# Patient Record
Sex: Female | Born: 1995 | Race: White | Hispanic: No | Marital: Single | Smoking: Current every day smoker
Health system: Southern US, Community
[De-identification: ages and names within clinical notes are randomized; demographics above are authoritative.]

## PROBLEM LIST (undated history)

## (undated) DIAGNOSIS — G510 Bell's palsy: Secondary | ICD-10-CM

## (undated) HISTORY — PX: BREAST LUMPECTOMY: SHX2

---
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
## 2017-03-15 ENCOUNTER — Encounter (HOSPITAL_COMMUNITY): Payer: Self-pay | Admitting: Emergency Medicine

## 2017-03-15 ENCOUNTER — Emergency Department (HOSPITAL_COMMUNITY)
Admission: EM | Admit: 2017-03-15 | Discharge: 2017-03-15 | Disposition: A | Discharge status: Home / Self Care | Payer: Medicaid Other | Attending: Emergency Medicine | Admitting: Emergency Medicine

## 2017-03-15 DIAGNOSIS — N12 Tubulo-interstitial nephritis, not specified as acute or chronic: Secondary | ICD-10-CM

## 2017-03-15 DIAGNOSIS — E860 Dehydration: Secondary | ICD-10-CM | POA: Diagnosis not present

## 2017-03-15 DIAGNOSIS — Z79899 Other long term (current) drug therapy: Secondary | ICD-10-CM | POA: Insufficient documentation

## 2017-03-15 DIAGNOSIS — R509 Fever, unspecified: Secondary | ICD-10-CM | POA: Diagnosis present

## 2017-03-15 DIAGNOSIS — F17210 Nicotine dependence, cigarettes, uncomplicated: Secondary | ICD-10-CM | POA: Diagnosis not present

## 2017-03-15 HISTORY — DX: Bell's palsy: G51.0

## 2017-03-15 LAB — COMPREHENSIVE METABOLIC PANEL
ALBUMIN: 3.6 g/dL (ref 3.5–5.0)
ALK PHOS: 60 U/L (ref 38–126)
ALT: 16 U/L (ref 14–54)
AST: 19 U/L (ref 15–41)
Anion gap: 8 (ref 5–15)
BILIRUBIN TOTAL: 0.8 mg/dL (ref 0.3–1.2)
BUN: 9 mg/dL (ref 6–20)
CHLORIDE: 106 mmol/L (ref 101–111)
CO2: 22 mmol/L (ref 22–32)
CREATININE: 0.78 mg/dL (ref 0.44–1.00)
Calcium: 8.6 mg/dL — ABNORMAL LOW (ref 8.9–10.3)
GFR calc non Af Amer: >60 mL/min (ref >60)
GLUCOSE: 95 mg/dL (ref 65–99)
Potassium: 3.3 mmol/L — ABNORMAL LOW (ref 3.5–5.1)
Sodium: 136 mmol/L (ref 135–145)
Total Protein: 6.8 g/dL (ref 6.5–8.1)

## 2017-03-15 LAB — CBC WITH DIFFERENTIAL/PLATELET
BASOS ABS: 0 10*3/uL (ref 0.0–0.1)
BASOS PCT: 0 %
Eosinophils Absolute: 0 10*3/uL (ref 0.0–0.7)
Eosinophils Relative: 0 %
HEMATOCRIT: 37.9 % (ref 36.0–46.0)
HEMOGLOBIN: 12.9 g/dL (ref 12.0–15.0)
Lymphocytes Relative: 5 %
Lymphs Abs: 1 10*3/uL (ref 0.7–4.0)
MCH: 29.7 pg (ref 26.0–34.0)
MCHC: 34 g/dL (ref 30.0–36.0)
MCV: 87.3 fL (ref 78.0–100.0)
Monocytes Absolute: 2 10*3/uL — ABNORMAL HIGH (ref 0.1–1.0)
Monocytes Relative: 11 %
NEUTROS ABS: 16 10*3/uL — AB (ref 1.7–7.7)
NEUTROS PCT: 84 %
Platelets: 173 10*3/uL (ref 150–400)
RBC: 4.34 MIL/uL (ref 3.87–5.11)
RDW: 13.1 % (ref 11.5–15.5)
WBC: 19 10*3/uL — AB (ref 4.0–10.5)

## 2017-03-15 LAB — URINALYSIS, ROUTINE W REFLEX MICROSCOPIC
Bilirubin Urine: NEGATIVE
GLUCOSE, UA: NEGATIVE mg/dL
Ketones, ur: 80 mg/dL — AB
Nitrite: NEGATIVE
PROTEIN: 100 mg/dL — AB
Specific Gravity, Urine: 1.021 (ref 1.005–1.030)
pH: 5 (ref 5.0–8.0)

## 2017-03-15 LAB — LIPASE, BLOOD: Lipase: 23 U/L (ref 11–51)

## 2017-03-15 LAB — PREGNANCY, URINE: PREG TEST UR: NEGATIVE

## 2017-03-15 MED ORDER — ONDANSETRON HCL 4 MG PO TABS: 4.0000 mg | ORAL_TABLET | Freq: Four times a day (QID) | ORAL | 0 refills | Status: AC | PRN

## 2017-03-15 MED ORDER — DEXTROSE 5 % IV SOLN
1.0000 g | Freq: Once | INTRAVENOUS | Status: AC
  Administered 2017-03-15: 1 g via INTRAVENOUS
  Filled 2017-03-15: qty 10

## 2017-03-15 MED ORDER — KETOROLAC TROMETHAMINE 30 MG/ML IJ SOLN
30.0000 mg | Freq: Once | INTRAMUSCULAR | Status: AC
  Administered 2017-03-15: 30 mg via INTRAVENOUS
  Filled 2017-03-15: qty 1

## 2017-03-15 MED ORDER — SODIUM CHLORIDE 0.9 % IV BOLUS (SEPSIS)
2000.0000 mL | Freq: Once | INTRAVENOUS | Status: AC
  Administered 2017-03-15: 2000 mL via INTRAVENOUS

## 2017-03-15 NOTE — ED Provider Notes (Signed)
[facility]-EMERGENCY DEPT Provider Note   CSN: [id number] Arrival date & time: 03/15/17  1254     History   Chief Complaint Chief Complaint  Patient presents with  . Dysuria    HPI Danielle Petersen is a 21 y.o. female.  HPI Patient with 2-3 days of fever, generalized myalgias, headache, urinary frequency, left flank pain and fatigue. Patient also has had episodic nausea and decreased oral intake. Denies diarrhea. No sore throat or rash. Was seen in the emergency department at [facility] yesterday diagnosed with UTI. Given prescription for [facility] which the patient has not filled. Past Medical History:  Diagnosis Date  . Bell's palsy     There are no active problems to display for this patient.   Past Surgical History:  Procedure Laterality Date  . BREAST LUMPECTOMY      OB History    No data available       Home Medications    Prior to Admission medications   Medication Sig Start Date End Date Taking? Authorizing Provider  ibuprofen (ADVIL,MOTRIN) 200 MG tablet Take 200-600 mg by mouth every 6 (six) hours as needed for moderate pain.    Yes [provider]  LO LOESTRIN FE 1 MG-10 MCG / 10 MCG tablet Take 1 tablet by mouth daily. 02/16/17  Yes [provider]  ondansetron (ZOFRAN) 4 MG tablet Take 1 tablet (4 mg total) by mouth every 6 (six) hours as needed for nausea or vomiting. 03/15/17   Loren Racer, MD    Family History History reviewed. No pertinent family history.  Social History Social History  Substance Use Topics  . Smoking status: Current Every Day Smoker    Packs/day: 0.50    Types: Cigarettes  . Smokeless tobacco: Not on file  . Alcohol use No     Allergies   Patient has no known allergies.   Review of Systems Review of Systems  Constitutional: Positive for appetite change, chills, fatigue and fever.  HENT: Positive for congestion and sinus pressure. Negative for facial swelling, sore throat and trouble swallowing.   Eyes:  Negative for visual disturbance.  Respiratory: Negative for cough, shortness of breath and wheezing.   Cardiovascular: Negative for chest pain, palpitations and leg swelling.  Gastrointestinal: Positive for nausea. Negative for abdominal pain, constipation, diarrhea and vomiting.  Genitourinary: Positive for flank pain and frequency. Negative for difficulty urinating, dysuria, hematuria, pelvic pain, vaginal bleeding and vaginal discharge.  Musculoskeletal: Positive for back pain, myalgias and neck pain. Negative for neck stiffness.  Skin: Negative for rash and wound.  Neurological: Positive for headaches. Negative for dizziness, weakness, light-headedness and numbness.  All other systems reviewed and are negative.    Physical Exam Updated Vital Signs BP 122/74 (BP Location: Right Arm)   Pulse 85   Temp 98.7 F (37.1 C) (Oral)   Resp 18   Ht 5\' 3"  (1.6 m)   Wt 55.3 kg (122 lb)   LMP 02/18/2017   SpO2 99%   BMI 21.61 kg/m   Physical Exam  Constitutional: She is oriented to person, place, and time. She appears well-developed and well-nourished. No distress.  HENT:  Head: Normocephalic and atraumatic.  Mouth/Throat: Oropharynx is clear and moist. No oropharyngeal exudate.  Mild sinus tenderness to percussion especially in the right frontal sinus. Oropharynx is mildly erythematous without tonsillar exudate. Uvula is midline.  Eyes: Pupils are equal, round, and reactive to light. EOM are normal.  Neck: Normal range of motion. Neck supple.  No meningismus or  cervical adenopathy. Patient does have some diffuse cervical paraspinal and bilateral trapezius muscle tenderness.  Cardiovascular: Regular rhythm.  Exam reveals no gallop and no friction rub.   No murmur heard. Tachycardia  Pulmonary/Chest: Effort normal and breath sounds normal. No stridor. No respiratory distress. She has no wheezes. She has no rales. She exhibits no tenderness.  Abdominal: Soft. Bowel sounds are normal. There  is no tenderness. There is no rebound and no guarding.  Musculoskeletal: Normal range of motion. She exhibits tenderness. She exhibits no edema.  Patient with left CVA tenderness to percussion. No midline thoracic or lumbar tenderness. No lower extremity swelling, asymmetry or tenderness. Distal pulses are 2+.  Lymphadenopathy:    She has no cervical adenopathy.  Neurological: She is alert and oriented to person, place, and time.  5/5 motor in all extremities. Sensation fully intact.  Skin: Skin is warm and dry. Capillary refill takes less than 2 seconds. No rash noted. She is not diaphoretic. No erythema.  Psychiatric: She has a normal mood and affect. Her behavior is normal.  Nursing note and vitals reviewed.    ED Treatments / Results  Labs (all labs ordered are listed, but only abnormal results are displayed) Labs Reviewed  CBC WITH DIFFERENTIAL/PLATELET - Abnormal; Notable for the following:       Result Value   WBC 19.0 (*)    Neutro Abs 16.0 (*)    Monocytes Absolute 2.0 (*)    All other components within normal limits  COMPREHENSIVE METABOLIC PANEL - Abnormal; Notable for the following:    Potassium 3.3 (*)    Calcium 8.6 (*)    All other components within normal limits  URINALYSIS, ROUTINE W REFLEX MICROSCOPIC - Abnormal; Notable for the following:    APPearance CLOUDY (*)    Hgb urine dipstick SMALL (*)    Ketones, ur 80 (*)    Protein, ur 100 (*)    Leukocytes, UA MODERATE (*)    Bacteria, UA RARE (*)    Squamous Epithelial / LPF 6-30 (*)    Non Squamous Epithelial 0-5 (*)    All other components within normal limits  URINE CULTURE  LIPASE, BLOOD  PREGNANCY, URINE    EKG  EKG Interpretation None       Radiology No results found.  Procedures Procedures (including critical care time)  Medications Ordered in ED Medications  sodium chloride 0.9 % bolus 2,000 mL (0 mLs Intravenous Stopped 03/15/17 1605)  ketorolac (TORADOL) 30 MG/ML injection 30 mg (30  mg Intravenous Given 03/15/17 1355)  cefTRIAXone (ROCEPHIN) 1 g in dextrose 5 % 50 mL IVPB (0 g Intravenous Stopped 03/15/17 1605)     Initial Impression / Assessment and Plan / ED Course  I have reviewed the triage vital signs and the nursing notes.  Pertinent labs & imaging results that were available during my care of the patient were reviewed by me and considered in my medical decision making (see chart for details).    Clinically patient has pyelonephritis on the left. She may also have URI in conjunction. Give IV fluids, anti-inflammatory and IV Rocephin.  Patient says she's feeling better after medication and IV fluids. Tachycardia has resolved. She is well-appearing. Advised to fill the prescription she had from [place]. Return precautions have been given. Final Clinical Impressions(s) / ED Diagnoses   Final diagnoses:  Pyelonephritis  Dehydration    New Prescriptions Discharge Medication List as of 03/15/2017  3:54 PM    START taking these medications  Details  ondansetron (ZOFRAN) 4 MG tablet Take 1 tablet (4 mg total) by mouth every 6 (six) hours as needed for nausea or vomiting., Starting Sat 03/15/2017, Print         Loren Racer, MD 03/16/17 [phone]

## 2017-03-15 NOTE — ED Triage Notes (Signed)
Pt reports fever, chills, left flank pain, and nausea for the past few days.  Dx with UTI at [facility] last night and given rx for Omnicef, but pt does not believe them and wanted a second opinion.  Did not start abx.  Took Motrin PTA.

## 2017-03-16 LAB — URINE CULTURE: Culture: NO GROWTH

## 2017-07-30 ENCOUNTER — Emergency Department (HOSPITAL_COMMUNITY)
Admission: EM | Admit: 2017-07-30 | Discharge: 2017-07-30 | Disposition: A | Discharge status: Home / Self Care | Payer: Self-pay | Attending: Emergency Medicine | Admitting: Emergency Medicine

## 2017-07-30 ENCOUNTER — Other Ambulatory Visit: Payer: Self-pay

## 2017-07-30 ENCOUNTER — Encounter (HOSPITAL_COMMUNITY): Payer: Self-pay | Admitting: Emergency Medicine

## 2017-07-30 DIAGNOSIS — F17210 Nicotine dependence, cigarettes, uncomplicated: Secondary | ICD-10-CM | POA: Insufficient documentation

## 2017-07-30 DIAGNOSIS — Z79899 Other long term (current) drug therapy: Secondary | ICD-10-CM | POA: Insufficient documentation

## 2017-07-30 DIAGNOSIS — N12 Tubulo-interstitial nephritis, not specified as acute or chronic: Secondary | ICD-10-CM | POA: Insufficient documentation

## 2017-07-30 DIAGNOSIS — R6883 Chills (without fever): Secondary | ICD-10-CM | POA: Insufficient documentation

## 2017-07-30 DIAGNOSIS — R112 Nausea with vomiting, unspecified: Secondary | ICD-10-CM | POA: Insufficient documentation

## 2017-07-30 LAB — URINALYSIS, ROUTINE W REFLEX MICROSCOPIC
Bilirubin Urine: NEGATIVE
GLUCOSE, UA: NEGATIVE mg/dL
Ketones, ur: NEGATIVE mg/dL
Nitrite: POSITIVE — AB
PH: 7 (ref 5.0–8.0)
PROTEIN: 30 mg/dL — AB
SPECIFIC GRAVITY, URINE: 1.012 (ref 1.005–1.030)

## 2017-07-30 LAB — PREGNANCY, URINE: PREG TEST UR: NEGATIVE

## 2017-07-30 MED ORDER — CEFTRIAXONE SODIUM 1 G IJ SOLR
1.0000 g | Freq: Once | INTRAMUSCULAR | Status: AC
  Administered 2017-07-30: 1 g via INTRAMUSCULAR
  Filled 2017-07-30: qty 10

## 2017-07-30 MED ORDER — KETOROLAC TROMETHAMINE 30 MG/ML IJ SOLN
30.0000 mg | Freq: Once | INTRAMUSCULAR | Status: AC
  Administered 2017-07-30: 30 mg via INTRAMUSCULAR
  Filled 2017-07-30: qty 1

## 2017-07-30 MED ORDER — ONDANSETRON 4 MG PO TBDP
4.0000 mg | ORAL_TABLET | Freq: Once | ORAL | Status: AC
  Administered 2017-07-30: 4 mg via ORAL
  Filled 2017-07-30: qty 1

## 2017-07-30 MED ORDER — LIDOCAINE HCL (PF) 1 % IJ SOLN
INTRAMUSCULAR | Status: AC
  Administered 2017-07-30: 07:00:00
  Filled 2017-07-30: qty 2

## 2017-07-30 MED ORDER — ONDANSETRON 4 MG PO TBDP: 4.0000 mg | ORAL_TABLET | Freq: Three times a day (TID) | ORAL | 0 refills | Status: DC | PRN

## 2017-07-30 MED ORDER — CEPHALEXIN 500 MG PO CAPS: 500.0000 mg | ORAL_CAPSULE | Freq: Three times a day (TID) | ORAL | 0 refills | Status: DC

## 2017-07-30 NOTE — Discharge Instructions (Signed)
You were seen today for right flank pain.  It appears that you have a urinary tract infection.  Take antibiotics as directed.  If you develop fevers or persistent nausea vomiting, worsening pain, you need to be reevaluated.

## 2017-07-30 NOTE — ED Provider Notes (Signed)
[facility] EMERGENCY DEPARTMENT Provider Note   CSN: [id number] Arrival date & time: 07/30/17  4098     History   Chief Complaint Chief Complaint  Patient presents with  . Flank Pain    HPI Danielle Petersen is a 22 y.o. female.  HPI  This is a 22 year old female who presents with right flank pain.  Patient reports 24-hour history of sharp right-sided flank pain that is nonradiating.  She reports similar pain in the past when she had a kidney infection.  She denies any dysuria or hematuria.  No history of kidney stones.  She currently rates her pain at 10 out of 10.  She has not taken anything for the pain.  She reports nausea and vomiting.  Denies fevers but does endorse chills.  Past Medical History:  Diagnosis Date  . Bell's palsy     There are no active problems to display for this patient.   Past Surgical History:  Procedure Laterality Date  . BREAST LUMPECTOMY      OB History    No data available       Home Medications    Prior to Admission medications   Medication Sig Start Date End Date Taking? Authorizing Provider  ibuprofen (ADVIL,MOTRIN) 200 MG tablet Take 200-600 mg by mouth every 6 (six) hours as needed for moderate pain.     [provider]  LO LOESTRIN FE 1 MG-10 MCG / 10 MCG tablet Take 1 tablet by mouth daily. 02/16/17   [provider]  ondansetron (ZOFRAN) 4 MG tablet Take 1 tablet (4 mg total) by mouth every 6 (six) hours as needed for nausea or vomiting. 03/15/17   Loren Racer, MD    Family History History reviewed. No pertinent family history.  Social History Social History   Tobacco Use  . Smoking status: Current Every Day Smoker    Packs/day: 0.50    Types: Cigarettes  . Smokeless tobacco: Never Used  Substance Use Topics  . Alcohol use: No  . Drug use: No     Allergies   Patient has no known allergies.   Review of Systems Review of Systems  Constitutional: Positive for chills. Negative for fever.    Respiratory: Negative for shortness of breath.   Cardiovascular: Negative for chest pain.  Gastrointestinal: Positive for nausea and vomiting. Negative for abdominal pain.  Genitourinary: Positive for flank pain. Negative for dysuria and hematuria.  All other systems reviewed and are negative.    Physical Exam Updated Vital Signs BP 106/60 (BP Location: Right Arm)   Pulse (!) 103   Temp 97.7 F (36.5 C) (Oral)   Resp 17   Ht 5\' 3"  (1.6 m)   Wt 48.5 kg (107 lb)   SpO2 100%   BMI 18.95 kg/m   Physical Exam  Constitutional: She is oriented to person, place, and time. She appears well-developed and well-nourished. No distress.  HENT:  Head: Normocephalic and atraumatic.  Cardiovascular: Normal rate, regular rhythm and normal heart sounds.  Pulmonary/Chest: Effort normal. No respiratory distress. She has no wheezes.  Abdominal: Soft. Bowel sounds are normal. There is no tenderness. There is no guarding.  Genitourinary:  Genitourinary Comments: Right-sided CVA tenderness  Neurological: She is alert and oriented to person, place, and time.  Skin: Skin is warm and dry.  Scaling rash noted just below the umbilicus with mild erythema  Psychiatric: She has a normal mood and affect.  Nursing note and vitals reviewed.    ED Treatments /  Results  Labs (all labs ordered are listed, but only abnormal results are displayed) Labs Reviewed  URINALYSIS, ROUTINE W REFLEX MICROSCOPIC - Abnormal; Notable for the following components:      Result Value   APPearance HAZY (*)    Hgb urine dipstick MODERATE (*)    Protein, ur 30 (*)    Nitrite POSITIVE (*)    Leukocytes, UA TRACE (*)    Bacteria, UA RARE (*)    Squamous Epithelial / LPF 0-5 (*)    All other components within normal limits  URINE CULTURE  PREGNANCY, URINE    EKG  EKG Interpretation None       Radiology No results found.  Procedures Procedures (including critical care time)  Medications Ordered in  ED Medications  cefTRIAXone (ROCEPHIN) injection 1 g (not administered)  lidocaine (PF) (XYLOCAINE) 1 % injection (not administered)  ketorolac (TORADOL) 30 MG/ML injection 30 mg (30 mg Intramuscular Given 07/30/17 0632)  ondansetron (ZOFRAN-ODT) disintegrating tablet 4 mg (4 mg Oral Given 07/30/17 [id number])     Initial Impression / Assessment and Plan / ED Course  I have reviewed the triage vital signs and the nursing notes.  Pertinent labs & imaging results that were available during my care of the patient were reviewed by me and considered in my medical decision making (see chart for details).    Patient presents with right flank pain.  Clinically consistent with pyelonephritis.  Although, patient does not have significant dysuria.  She is mildly tachycardic.  Otherwise nontoxic.  She does have CVA tenderness on exam.  Urine is nitrite positive with 6-30 white cells and rare bacteria.  Urine culture sent.  Patient reports improvement of pain with Toradol.  She is afebrile.  Do not feel that other lab work is indicated at this time.  Patient given IM Rocephin.  Will discharge on Keflex.  After history, exam, and medical workup I feel the patient has been appropriately medically screened and is safe for discharge home. Pertinent diagnoses were discussed with the patient. Patient was given return precautions.   Final Clinical Impressions(s) / ED Diagnoses   Final diagnoses:  Pyelonephritis    ED Discharge Orders    None       Francois Elk, Mayer Maskerourtney F, MD 07/30/17 [phone]

## 2017-07-30 NOTE — ED Triage Notes (Signed)
Pt complains of right flank pain that started 2 days ago and has become worse over the night. Pt states she has not been able to sleep the past couple nights because of the pain. Pt walked down hallway with steady gait. Pt states her pain is a level 10 on a scale of 0 to 10. Pt states she has had nausea and vomiting. Pt states she has only vomited once, which was this morning, in a 24 hour period. Pt denies liquid stools. Pt states she feels weak as well.

## 2017-08-01 LAB — URINE CULTURE

## 2017-08-02 ENCOUNTER — Telehealth: Payer: Self-pay

## 2017-08-02 NOTE — Telephone Encounter (Signed)
Post ED Visit - Positive Culture Follow-up  Culture report reviewed by antimicrobial stewardship pharmacist:  []  Enzo BiNathan Batchelder, Pharm.D. []  Celedonio MiyamotoJeremy Frens, Pharm.D., BCPS AQ-ID [x]  Garvin FilaMike Maccia, Pharm.D., BCPS []  Georgina PillionElizabeth Martin, Pharm.D., BCPS []  HollandMinh Pham, [address] Rainbow BoulevardPharm.D., BCPS, AAHIVP []  Estella HuskMichelle Turner, Pharm.D., BCPS, AAHIVP []  Lysle Pearlachel Rumbarger, PharmD, BCPS []  Blake DivineShannon Parkey, PharmD []  Pollyann SamplesAndy Johnston, PharmD, BCPS  Positive urine culture Treated with Cephalexin, organism sensitive to the same and no further patient follow-up is required at this time.  Jerry CarasCullom, Naileah Karg Burnett 08/02/2017, 11:59 AM

## 2018-07-17 ENCOUNTER — Other Ambulatory Visit: Payer: Self-pay

## 2018-07-17 ENCOUNTER — Emergency Department (HOSPITAL_COMMUNITY)
Admission: EM | Admit: 2018-07-17 | Discharge: 2018-07-17 | Disposition: A | Discharge status: Home / Self Care | Payer: 59 | Attending: Emergency Medicine | Admitting: Emergency Medicine

## 2018-07-17 ENCOUNTER — Encounter (HOSPITAL_COMMUNITY): Payer: Self-pay

## 2018-07-17 DIAGNOSIS — F17210 Nicotine dependence, cigarettes, uncomplicated: Secondary | ICD-10-CM | POA: Diagnosis not present

## 2018-07-17 DIAGNOSIS — M791 Myalgia, unspecified site: Secondary | ICD-10-CM | POA: Diagnosis present

## 2018-07-17 DIAGNOSIS — N3090 Cystitis, unspecified without hematuria: Secondary | ICD-10-CM | POA: Insufficient documentation

## 2018-07-17 DIAGNOSIS — N3001 Acute cystitis with hematuria: Secondary | ICD-10-CM

## 2018-07-17 LAB — URINALYSIS, ROUTINE W REFLEX MICROSCOPIC
Bilirubin Urine: NEGATIVE
Glucose, UA: NEGATIVE mg/dL
Hgb urine dipstick: NEGATIVE
Ketones, ur: 20 mg/dL — AB
Nitrite: POSITIVE — AB
Protein, ur: 30 mg/dL — AB
Specific Gravity, Urine: 1.02 (ref 1.005–1.030)
WBC, UA: >50 WBC/hpf — ABNORMAL HIGH (ref 0–5)
pH: 6 (ref 5.0–8.0)

## 2018-07-17 LAB — COMPREHENSIVE METABOLIC PANEL
ALT: 16 U/L (ref 0–44)
AST: 16 U/L (ref 15–41)
Albumin: 3.8 g/dL (ref 3.5–5.0)
Alkaline Phosphatase: 59 U/L (ref 38–126)
Anion gap: 8 (ref 5–15)
BUN: 8 mg/dL (ref 6–20)
CALCIUM: 8.2 mg/dL — AB (ref 8.9–10.3)
CO2: 17 mmol/L — ABNORMAL LOW (ref 22–32)
Chloride: 110 mmol/L (ref 98–111)
Creatinine, Ser: 0.71 mg/dL (ref 0.44–1.00)
GFR calc non Af Amer: >60 mL/min (ref >60)
Glucose, Bld: 102 mg/dL — ABNORMAL HIGH (ref 70–99)
Potassium: 3.3 mmol/L — ABNORMAL LOW (ref 3.5–5.1)
Sodium: 135 mmol/L (ref 135–145)
Total Bilirubin: 0.7 mg/dL (ref 0.3–1.2)
Total Protein: 6.8 g/dL (ref 6.5–8.1)

## 2018-07-17 LAB — CBC WITH DIFFERENTIAL/PLATELET
ABS IMMATURE GRANULOCYTES: 0.15 10*3/uL — AB (ref 0.00–0.07)
BASOS ABS: 0 10*3/uL (ref 0.0–0.1)
BASOS PCT: 0 %
EOS ABS: 0 10*3/uL (ref 0.0–0.5)
Eosinophils Relative: 0 %
HCT: 40 % (ref 36.0–46.0)
Hemoglobin: 12.9 g/dL (ref 12.0–15.0)
IMMATURE GRANULOCYTES: 1 %
LYMPHS ABS: 0.8 10*3/uL (ref 0.7–4.0)
Lymphocytes Relative: 5 %
MCH: 28.7 pg (ref 26.0–34.0)
MCHC: 32.3 g/dL (ref 30.0–36.0)
MCV: 88.9 fL (ref 80.0–100.0)
MONOS PCT: 4 %
Monocytes Absolute: 0.7 10*3/uL (ref 0.1–1.0)
NEUTROS ABS: 14.5 10*3/uL — AB (ref 1.7–7.7)
NEUTROS PCT: 90 %
NRBC: 0 % (ref 0.0–0.2)
Platelets: 221 10*3/uL (ref 150–400)
RBC: 4.5 MIL/uL (ref 3.87–5.11)
RDW: 12 % (ref 11.5–15.5)
WBC: 15.9 10*3/uL — ABNORMAL HIGH (ref 4.0–10.5)

## 2018-07-17 LAB — INFLUENZA PANEL BY PCR (TYPE A & B)
INFLAPCR: NEGATIVE
INFLBPCR: NEGATIVE

## 2018-07-17 LAB — CBG MONITORING, ED: Glucose-Capillary: 129 mg/dL — ABNORMAL HIGH (ref 70–99)

## 2018-07-17 MED ORDER — ACETAMINOPHEN 325 MG PO TABS
650.0000 mg | ORAL_TABLET | Freq: Once | ORAL | Status: AC
  Administered 2018-07-17: 650 mg via ORAL
  Filled 2018-07-17: qty 2

## 2018-07-17 MED ORDER — CEPHALEXIN 500 MG PO CAPS: 500.0000 mg | ORAL_CAPSULE | Freq: Four times a day (QID) | ORAL | 0 refills | Status: AC

## 2018-07-17 MED ORDER — SODIUM CHLORIDE 0.9 % IV BOLUS
1000.0000 mL | Freq: Once | INTRAVENOUS | Status: AC
  Administered 2018-07-17: 1000 mL via INTRAVENOUS

## 2018-07-17 MED ORDER — KETOROLAC TROMETHAMINE 30 MG/ML IJ SOLN
30.0000 mg | Freq: Once | INTRAMUSCULAR | Status: AC
Start: 2018-07-17 — End: 2018-07-17
  Administered 2018-07-17: 30 mg via INTRAVENOUS
  Filled 2018-07-17: qty 1

## 2018-07-17 MED ORDER — SODIUM CHLORIDE 0.9 % IV SOLN
1.0000 g | Freq: Once | INTRAVENOUS | Status: AC
  Administered 2018-07-17: 1 g via INTRAVENOUS
  Filled 2018-07-17: qty 10

## 2018-07-17 MED ORDER — ONDANSETRON 4 MG PO TBDP: ORAL_TABLET | ORAL | 0 refills | Status: AC

## 2018-07-17 MED ORDER — ONDANSETRON HCL 4 MG/2ML IJ SOLN
4.0000 mg | Freq: Once | INTRAMUSCULAR | Status: AC
  Administered 2018-07-17: 4 mg via INTRAVENOUS
  Filled 2018-07-17: qty 2

## 2018-07-17 NOTE — Discharge Instructions (Signed)
Drink plenty of fluids.  Take Tylenol or Motrin for fevers and aches follow-up with your doctor next week for recheck

## 2018-07-17 NOTE — ED Triage Notes (Signed)
Pt reports generalized body aches, HA, fever, cough that began suddenly last night

## 2018-07-17 NOTE — ED Provider Notes (Signed)
[facility] EMERGENCY DEPARTMENT Provider Note   CSN: [id number] Arrival date & time: 07/17/18  1314     History   Chief Complaint Chief Complaint  Patient presents with  . Generalized Body Aches    HPI Consepcion Hearingmber Petersen is a 22 y.o. female.  Patient complains of fever chills nausea  The history is provided by the patient. No language interpreter was used.  Illness  This is a new problem. The current episode started more than 2 days ago. The problem occurs constantly. The problem has not changed since onset.Pertinent negatives include no chest pain, no abdominal pain and no headaches. Nothing aggravates the symptoms. Nothing relieves the symptoms. She has tried nothing for the symptoms. The treatment provided no relief.    Past Medical History:  Diagnosis Date  . Bell's palsy     There are no active problems to display for this patient.   Past Surgical History:  Procedure Laterality Date  . BREAST LUMPECTOMY       OB History   No obstetric history on file.      Home Medications    Prior to Admission medications   Medication Sig Start Date End Date Taking? Authorizing Provider  acetaminophen (TYLENOL) 325 MG tablet Take 650 mg by mouth every 6 (six) hours as needed.   Yes [provider]  medroxyPROGESTERone (DEPO-PROVERA) 150 MG/ML injection Inject 150 mg into the muscle every 3 (three) months.   Yes [provider]  cephALEXin (KEFLEX) 500 MG capsule Take 1 capsule (500 mg total) by mouth 4 (four) times daily. 07/17/18   Bethann BerkshireZammit, Maely Clements, MD  LO LOESTRIN FE 1 MG-10 MCG / 10 MCG tablet Take 1 tablet by mouth daily. 02/16/17   [provider]  ondansetron (ZOFRAN ODT) 4 MG disintegrating tablet 4mg  ODT q4 hours prn nausea/vomit 07/17/18   Bethann BerkshireZammit, Parvin Stetzer, MD  ondansetron (ZOFRAN) 4 MG tablet Take 1 tablet (4 mg total) by mouth every 6 (six) hours as needed for nausea or vomiting. Patient not taking: Reported on 07/17/2018 03/15/17    Loren RacerYelverton, David, MD    Family History No family history on file.  Social History Social History   Tobacco Use  . Smoking status: Current Every Day Smoker    Packs/day: 0.50    Types: Cigarettes  . Smokeless tobacco: Never Used  Substance Use Topics  . Alcohol use: No  . Drug use: No     Allergies   Patient has no known allergies.   Review of Systems Review of Systems  Constitutional: Positive for fatigue and fever. Negative for appetite change.  HENT: Negative for congestion, ear discharge and sinus pressure.   Eyes: Negative for discharge.  Respiratory: Negative for cough.   Cardiovascular: Negative for chest pain.  Gastrointestinal: Negative for abdominal pain and diarrhea.  Genitourinary: Negative for frequency and hematuria.  Musculoskeletal: Negative for back pain.  Skin: Negative for rash.  Neurological: Negative for seizures and headaches.  Psychiatric/Behavioral: Negative for hallucinations.     Physical Exam Updated Vital Signs BP 115/63   Pulse (!) 118   Temp (!) 100.6 F (38.1 C) (Oral)   Resp 20   Ht 5\' 3"  (1.6 m)   Wt 54.4 kg   SpO2 100%   BMI 21.26 kg/m   Physical Exam Vitals signs reviewed.  Constitutional:      Appearance: She is well-developed.  HENT:     Head: Normocephalic.     Nose: Nose normal.  Eyes:     General:  No scleral icterus.    Conjunctiva/sclera: Conjunctivae normal.  Neck:     Musculoskeletal: Neck supple.     Thyroid: No thyromegaly.  Cardiovascular:     Rate and Rhythm: Normal rate and regular rhythm.     Heart sounds: No murmur. No friction rub. No gallop.   Pulmonary:     Breath sounds: No stridor. No wheezing or rales.  Chest:     Chest wall: No tenderness.  Abdominal:     General: There is no distension.     Tenderness: There is no abdominal tenderness. There is no rebound.  Musculoskeletal: Normal range of motion.  Lymphadenopathy:     Cervical: No cervical adenopathy.  Skin:    Findings: No  erythema or rash.  Neurological:     Mental Status: She is alert and oriented to person, place, and time.     Motor: No abnormal muscle tone.     Coordination: Coordination normal.  Psychiatric:        Behavior: Behavior normal.      ED Treatments / Results  Labs (all labs ordered are listed, but only abnormal results are displayed) Labs Reviewed  CBC WITH DIFFERENTIAL/PLATELET - Abnormal; Notable for the following components:      Result Value   WBC 15.9 (*)    Neutro Abs 14.5 (*)    Abs Immature Granulocytes 0.15 (*)    All other components within normal limits  COMPREHENSIVE METABOLIC PANEL - Abnormal; Notable for the following components:   Potassium 3.3 (*)    CO2 17 (*)    Glucose, Bld 102 (*)    Calcium 8.2 (*)    All other components within normal limits  URINALYSIS, ROUTINE W REFLEX MICROSCOPIC - Abnormal; Notable for the following components:   Color, Urine Danielle Petersen (*)    APPearance HAZY (*)    Ketones, ur 20 (*)    Protein, ur 30 (*)    Nitrite POSITIVE (*)    Leukocytes, UA TRACE (*)    WBC, UA >50 (*)    Bacteria, UA MANY (*)    All other components within normal limits  CBG MONITORING, ED - Abnormal; Notable for the following components:   Glucose-Capillary 129 (*)    All other components within normal limits  URINE CULTURE  INFLUENZA PANEL BY PCR (TYPE A & B)    EKG None  Radiology No results found.  Procedures Procedures (including critical care time)  Medications Ordered in ED Medications  cefTRIAXone (ROCEPHIN) 1 g in sodium chloride 0.9 % 100 mL IVPB (has no administration in time range)  sodium chloride 0.9 % bolus 1,000 mL (0 mLs Intravenous Stopped 07/17/18 1903)  ketorolac (TORADOL) 30 MG/ML injection 30 mg (30 mg Intravenous Given 07/17/18 1626)  ondansetron (ZOFRAN) injection 4 mg (4 mg Intravenous Given 07/17/18 1626)     Initial Impression / Assessment and Plan / ED Course  I have reviewed the triage vital signs and the nursing  notes.  Pertinent labs & imaging results that were available during my care of the patient were reviewed by me and considered in my medical decision making (see chart for details).     Patient with urinary tract infection.  She is given Keflex and Zofran and will follow-up with her doctor  Final Clinical Impressions(s) / ED Diagnoses   Final diagnoses:  Acute cystitis with hematuria    ED Discharge Orders         Ordered    cephALEXin (KEFLEX) 500 MG  capsule  4 times daily     07/17/18 1959    ondansetron (ZOFRAN ODT) 4 MG disintegrating tablet     07/17/18 1959           Bethann BerkshireZammit, Chawn Spraggins, MD 07/17/18 2002

## 2018-07-17 NOTE — ED Triage Notes (Signed)
Pt provided a face mask due to symptoms

## 2018-07-20 LAB — URINE CULTURE: Culture: >=100000 — AB

## 2018-07-21 ENCOUNTER — Telehealth: Payer: Self-pay | Admitting: *Deleted

## 2018-07-21 NOTE — Telephone Encounter (Signed)
Post ED Visit - Positive Culture Follow-up  Culture report reviewed by antimicrobial stewardship pharmacist:  []  Danielle Petersen, Pharm.D. []  Danielle Petersen, Pharm.D., BCPS AQ-ID []  Danielle Petersen, Pharm.D., BCPS []  Danielle Petersen, Pharm.D., BCPS []  Danielle Petersen, [place]Pharm[place]D., BCPS, AAHIVP []  Danielle Petersen, Pharm.D., BCPS, AAHIVP [x]  Danielle Petersen, PharmD, BCPS []  Danielle Petersen, PharmD, BCPS []  Danielle Petersen, PharmD, BCPS []  Danielle Petersen, PharmD  Positive urine culture Treated with Cephalexin, organism sensitive to the same and no further patient follow-up is required at this time.  Virl AxeRobertson, Johanna Matto [facility] 07/21/2018, 10:35 AM

## 2020-12-27 ENCOUNTER — Other Ambulatory Visit: Payer: Self-pay | Admitting: Diagnostic Radiology

## 2023-10-23 ENCOUNTER — Encounter (HOSPITAL_COMMUNITY): Payer: Self-pay

## 2023-10-23 ENCOUNTER — Emergency Department (HOSPITAL_COMMUNITY)
Admission: EM | Admit: 2023-10-23 | Discharge: 2023-10-24 | Disposition: A | Discharge status: Home / Self Care | Payer: Self-pay | Attending: Emergency Medicine | Admitting: Emergency Medicine

## 2023-10-23 ENCOUNTER — Emergency Department (HOSPITAL_COMMUNITY): Payer: Self-pay

## 2023-10-23 ENCOUNTER — Other Ambulatory Visit: Payer: Self-pay

## 2023-10-23 DIAGNOSIS — J189 Pneumonia, unspecified organism: Secondary | ICD-10-CM | POA: Insufficient documentation

## 2023-10-23 MED ORDER — ALBUTEROL SULFATE HFA 108 (90 BASE) MCG/ACT IN AERS
2.0000 | INHALATION_SPRAY | RESPIRATORY_TRACT | Status: DC | PRN
  Administered 2023-10-24 (×2): 2 via RESPIRATORY_TRACT
  Filled 2023-10-23: qty 6.7

## 2023-10-23 NOTE — ED Triage Notes (Signed)
 Coughing, shortness of breath, body aches, congestion, and hot and cold chills. All of the symptoms started Tuesday night.

## 2023-10-23 NOTE — ED Notes (Signed)
 Pt has increased work of breathing after ambulating from lobby to room and after using the bathroom.   Urine sample provided and on the counter in the pts room

## 2023-10-24 ENCOUNTER — Emergency Department (HOSPITAL_COMMUNITY): Payer: Self-pay

## 2023-10-24 LAB — CBC WITH DIFFERENTIAL/PLATELET
Abs Immature Granulocytes: 0.04 10*3/uL (ref 0.00–0.07)
Basophils Absolute: 0 10*3/uL (ref 0.0–0.1)
Basophils Relative: 0 %
Eosinophils Absolute: 0.1 10*3/uL (ref 0.0–0.5)
Eosinophils Relative: 1 %
HCT: 41.9 % (ref 36.0–46.0)
Hemoglobin: 13.4 g/dL (ref 12.0–15.0)
Immature Granulocytes: 0 %
Lymphocytes Relative: 12 %
Lymphs Abs: 1.3 10*3/uL (ref 0.7–4.0)
MCH: 25.7 pg — ABNORMAL LOW (ref 26.0–34.0)
MCHC: 32 g/dL (ref 30.0–36.0)
MCV: 80.4 fL (ref 80.0–100.0)
Monocytes Absolute: 0.5 10*3/uL (ref 0.1–1.0)
Monocytes Relative: 5 %
Neutro Abs: 8.4 10*3/uL — ABNORMAL HIGH (ref 1.7–7.7)
Neutrophils Relative %: 82 %
Platelets: 267 10*3/uL (ref 150–400)
RBC: 5.21 MIL/uL — ABNORMAL HIGH (ref 3.87–5.11)
RDW: 14.9 % (ref 11.5–15.5)
WBC: 10.4 10*3/uL (ref 4.0–10.5)
nRBC: 0 % (ref 0.0–0.2)

## 2023-10-24 LAB — BASIC METABOLIC PANEL WITH GFR
Anion gap: 10 (ref 5–15)
BUN: 10 mg/dL (ref 6–20)
CO2: 20 mmol/L — ABNORMAL LOW (ref 22–32)
Calcium: 8.4 mg/dL — ABNORMAL LOW (ref 8.9–10.3)
Chloride: 105 mmol/L (ref 98–111)
Creatinine, Ser: 0.81 mg/dL (ref 0.44–1.00)
GFR, Estimated: >60 mL/min (ref >60)
Glucose, Bld: 120 mg/dL — ABNORMAL HIGH (ref 70–99)
Potassium: 3.6 mmol/L (ref 3.5–5.1)
Sodium: 135 mmol/L (ref 135–145)

## 2023-10-24 LAB — LACTIC ACID, PLASMA: Lactic Acid, Venous: 1.1 mmol/L (ref 0.5–1.9)

## 2023-10-24 LAB — URINALYSIS, W/ REFLEX TO CULTURE (INFECTION SUSPECTED)
Bacteria, UA: NONE SEEN
Bilirubin Urine: NEGATIVE
Glucose, UA: NEGATIVE mg/dL
Ketones, ur: NEGATIVE mg/dL
Nitrite: NEGATIVE
Protein, ur: 30 mg/dL — AB
Specific Gravity, Urine: 1.024 (ref 1.005–1.030)
pH: 6 (ref 5.0–8.0)

## 2023-10-24 LAB — SARS CORONAVIRUS 2 BY RT PCR: SARS Coronavirus 2 by RT PCR: NEGATIVE

## 2023-10-24 MED ORDER — LACTATED RINGERS IV BOLUS: 1000.0000 mL | Freq: Once | INTRAVENOUS | Status: DC

## 2023-10-24 MED ORDER — SODIUM CHLORIDE 0.9 % IV SOLN
1.0000 g | Freq: Once | INTRAVENOUS | Status: AC
  Administered 2023-10-24: 1 g via INTRAVENOUS
  Filled 2023-10-24: qty 10

## 2023-10-24 MED ORDER — AZITHROMYCIN 250 MG PO TABS: 250.0000 mg | ORAL_TABLET | Freq: Every day | ORAL | 0 refills | Status: AC

## 2023-10-24 MED ORDER — IBUPROFEN 400 MG PO TABS
400.0000 mg | ORAL_TABLET | Freq: Once | ORAL | Status: AC
  Administered 2023-10-24: 400 mg via ORAL
  Filled 2023-10-24: qty 1

## 2023-10-24 MED ORDER — LACTATED RINGERS IV BOLUS
1000.0000 mL | Freq: Once | INTRAVENOUS | Status: AC
  Administered 2023-10-24: 1000 mL via INTRAVENOUS

## 2023-10-24 MED ORDER — SODIUM CHLORIDE 0.9 % IV SOLN
500.0000 mg | Freq: Once | INTRAVENOUS | Status: AC
  Administered 2023-10-24: 500 mg via INTRAVENOUS
  Filled 2023-10-24: qty 5

## 2023-10-24 MED ORDER — CEFDINIR 300 MG PO CAPS: 300.0000 mg | ORAL_CAPSULE | Freq: Two times a day (BID) | ORAL | 0 refills | Status: AC

## 2023-10-24 MED ORDER — IOHEXOL 350 MG/ML SOLN
75.0000 mL | Freq: Once | INTRAVENOUS | Status: AC | PRN
  Administered 2023-10-24: 75 mL via INTRAVENOUS

## 2023-10-24 MED ORDER — ACETAMINOPHEN 500 MG PO TABS
1000.0000 mg | ORAL_TABLET | Freq: Once | ORAL | Status: AC
  Administered 2023-10-24: 1000 mg via ORAL
  Filled 2023-10-24: qty 2

## 2023-10-24 NOTE — ED Provider Notes (Signed)
 [place] EMERGENCY DEPARTMENT AT [facility] Provider Note   CSN: [id number] Arrival date & time: 10/23/23  2207     History {Add pertinent medical, surgical, social history, OB history to HPI:1} Chief Complaint  Patient presents with   Shortness of Breath    Danielle Petersen is a 28 y.o. female.  Patient presents to the emergency department for evaluation of shortness of breath.  Patient reports that symptoms began 2 days ago.  She has been running a fever, has generalized aches pains, chills.       Home Medications Prior to Admission medications   Medication Sig Start Date End Date Taking? Authorizing Provider  acetaminophen (TYLENOL) 325 MG tablet Take 650 mg by mouth every 6 (six) hours as needed.    [provider]  cephALEXin (KEFLEX) 500 MG capsule Take 1 capsule (500 mg total) by mouth 4 (four) times daily. 07/17/18   Bethann Berkshire, MD  LO LOESTRIN FE 1 MG-10 MCG / 10 MCG tablet Take 1 tablet by mouth daily. 02/16/17   [provider]  medroxyPROGESTERone (DEPO-PROVERA) 150 MG/ML injection Inject 150 mg into the muscle every 3 (three) months.    [provider]  ondansetron (ZOFRAN ODT) 4 MG disintegrating tablet 4mg  ODT q4 hours prn nausea/vomit 07/17/18   Bethann Berkshire, MD  ondansetron (ZOFRAN) 4 MG tablet Take 1 tablet (4 mg total) by mouth every 6 (six) hours as needed for nausea or vomiting. Patient not taking: Reported on 07/17/2018 03/15/17   Loren Racer, MD      Allergies    Patient has no known allergies.    Review of Systems   Review of Systems  Physical Exam Updated Vital Signs BP 107/81 (BP Location: Right Arm)   Pulse (!) 106   Temp (!) 101.1 F (38.4 C) (Oral)   Resp 19   Ht 5\' 3"  (1.6 m)   Wt 86.2 kg   SpO2 96%   BMI 33.66 kg/m  Physical Exam Vitals and nursing note reviewed.  Constitutional:      General: She is not in acute distress.    Appearance: She is well-developed.  HENT:     Head:  Normocephalic and atraumatic.     Mouth/Throat:     Mouth: Mucous membranes are moist.  Eyes:     General: Vision grossly intact. Gaze aligned appropriately.     Extraocular Movements: Extraocular movements intact.     Conjunctiva/sclera: Conjunctivae normal.  Cardiovascular:     Rate and Rhythm: Regular rhythm. Tachycardia present.     Pulses: Normal pulses.     Heart sounds: Normal heart sounds, S1 normal and S2 normal. No murmur heard.    No friction rub. No gallop.  Pulmonary:     Effort: Pulmonary effort is normal. No respiratory distress.     Breath sounds: Normal breath sounds.  Abdominal:     General: Bowel sounds are normal.     Palpations: Abdomen is soft.     Tenderness: There is no abdominal tenderness. There is no guarding or rebound.     Hernia: No hernia is present.  Musculoskeletal:        General: No swelling.     Cervical back: Full passive range of motion without pain, normal range of motion and neck supple. No spinous process tenderness or muscular tenderness. Normal range of motion.     Right lower leg: No edema.     Left lower leg: No edema.  Skin:    General:  Skin is warm and dry.     Capillary Refill: Capillary refill takes less than 2 seconds.     Findings: No ecchymosis, erythema, rash or wound.  Neurological:     General: No focal deficit present.     Mental Status: She is alert and oriented to person, place, and time.     GCS: GCS eye subscore is 4. GCS verbal subscore is 5. GCS motor subscore is 6.     Cranial Nerves: Cranial nerves 2-12 are intact.     Sensory: Sensation is intact.     Motor: Motor function is intact.     Coordination: Coordination is intact.  Psychiatric:        Attention and Perception: Attention normal.        Mood and Affect: Mood normal.        Speech: Speech normal.        Behavior: Behavior normal.     ED Results / Procedures / Treatments   Labs (all labs ordered are listed, but only abnormal results are  displayed) Labs Reviewed  SARS CORONAVIRUS 2 BY RT PCR  CBC WITH DIFFERENTIAL/PLATELET  BASIC METABOLIC PANEL WITH GFR  LACTIC ACID, PLASMA  URINALYSIS, W/ REFLEX TO CULTURE (INFECTION SUSPECTED)    EKG EKG Interpretation Date/Time:  Thursday October 23 2023 22:16:32 EDT Ventricular Rate:  129 PR Interval:  124 QRS Duration:  74 QT Interval:  270 QTC Calculation: 395 R Axis:   84  Text Interpretation: Sinus tachycardia ST & T wave abnormality, consider inferior ischemia ST & T wave abnormality, consider anterolateral ischemia Abnormal ECG No previous ECGs available Confirmed by Gilda Crease [phone]) on 10/23/2023 11:46:37 PM  Radiology DG Chest 2 View Result Date: 10/23/2023 CLINICAL DATA:  shortness of breath EXAM: CHEST - 2 VIEW COMPARISON:  None Available. FINDINGS: Cardiac silhouette is unremarkable. No pneumothorax. No pleural effusion. The visualized skeletal structures are unremarkable. Alveolar opacity consistent with right lower lobepneumonia versus volume loss. Follow up recommended to confirm resolution as a precaution. IMPRESSION: Right lower lobe pneumonia versus volume loss. Follow up recommended. Electronically Signed   By: Layla Maw M.D.   On: 10/23/2023 22:32    Procedures Procedures  {Document cardiac monitor, telemetry assessment procedure when appropriate:1}  Medications Ordered in ED Medications  albuterol (VENTOLIN HFA) 108 (90 Base) MCG/ACT inhaler 2 puff (has no administration in time range)  acetaminophen (TYLENOL) tablet 1,000 mg (has no administration in time range)  ibuprofen (ADVIL) tablet 400 mg (has no administration in time range)  lactated ringers bolus 1,000 mL (has no administration in time range)    ED Course/ Medical Decision Making/ A&P   {   Click here for ABCD2, HEART and other calculatorsREFRESH Note before signing :1}                              Medical Decision Making Amount and/or Complexity of Data Reviewed Labs:  ordered. Radiology: ordered.  Risk OTC drugs. Prescription drug management.   ***  {Document critical care time when appropriate:1} {Document review of labs and clinical decision tools ie heart score, Chads2Vasc2 etc:1}  {Document your independent review of radiology images, and any outside records:1} {Document your discussion with family members, caretakers, and with consultants:1} {Document social determinants of health affecting pt's care:1} {Document your decision making why or why not admission, treatments were needed:1} Final Clinical Impression(s) / ED Diagnoses Final diagnoses:  None  Rx / DC Orders ED Discharge Orders     None
